# Patient Record
Sex: Female | Born: 1975 | Race: White | Hispanic: No | Marital: Married | Smoking: Never smoker
Health system: Southern US, Community
[De-identification: ages and names within clinical notes are randomized; demographics above are authoritative.]

## PROBLEM LIST (undated history)

## (undated) DIAGNOSIS — Z8489 Family history of other specified conditions: Secondary | ICD-10-CM

## (undated) DIAGNOSIS — F329 Major depressive disorder, single episode, unspecified: Secondary | ICD-10-CM

## (undated) DIAGNOSIS — R51 Headache: Secondary | ICD-10-CM

## (undated) DIAGNOSIS — M797 Fibromyalgia: Secondary | ICD-10-CM

## (undated) DIAGNOSIS — F419 Anxiety disorder, unspecified: Secondary | ICD-10-CM

## (undated) DIAGNOSIS — Z9289 Personal history of other medical treatment: Secondary | ICD-10-CM

## (undated) DIAGNOSIS — I10 Essential (primary) hypertension: Secondary | ICD-10-CM

## (undated) DIAGNOSIS — T4145XA Adverse effect of unspecified anesthetic, initial encounter: Secondary | ICD-10-CM

## (undated) DIAGNOSIS — K219 Gastro-esophageal reflux disease without esophagitis: Secondary | ICD-10-CM

## (undated) DIAGNOSIS — F32A Depression, unspecified: Secondary | ICD-10-CM

## (undated) DIAGNOSIS — T8859XA Other complications of anesthesia, initial encounter: Secondary | ICD-10-CM

## (undated) DIAGNOSIS — M1990 Unspecified osteoarthritis, unspecified site: Secondary | ICD-10-CM

## (undated) HISTORY — PX: BACK SURGERY: SHX140

---

## 2011-06-30 DIAGNOSIS — Z9289 Personal history of other medical treatment: Secondary | ICD-10-CM

## 2011-06-30 HISTORY — DX: Personal history of other medical treatment: Z92.89

## 2011-06-30 HISTORY — PX: RECTAL SURGERY: SHX760

## 2014-02-07 NOTE — H&P (Signed)
  Suzanne Bates is an 38 y.o. female.    Chief Complaint: left carpal tunnel syndrome  HPI: Pt is a 38 y.o. female complaining of left wrist pain and numbness for multiple months. Pain had continually increased since the beginning. Pt has tried various conservative treatments which have failed to alleviate their symptoms, including splinting and therapy. Various options are discussed with the patient. Risks, benefits and expectations were discussed with the patient. Patient understand the risks, benefits and expectations and wishes to proceed with surgery.   PCP:  Milda SmartALTON,TIMOTHY J, MD  D/C Plans:  Home   PMH: No past medical history on file.  PSH: No past surgical history on file.  Social History:  has no tobacco, alcohol, and drug history on file.  Allergies:  Allergies not on file  Medications: No current facility-administered medications for this encounter.   No current outpatient prescriptions on file.    No results found for this or any previous visit (from the past 48 hour(s)). No results found.  ROS: Pain with rom of the left upper extremity  Physical Exam:  Alert and oriented 38 y.o. female in no acute distress Cranial nerves 2-12 intact Cervical spine: full rom with no tenderness, nv intact distally Chest: active breath sounds bilaterally, no wheeze rhonchi or rales Heart: regular rate and rhythm, no murmur Abd: non tender non distended with active bowel sounds Hip is stable with rom  left wrist with positive tinel's and phalen's  nv intact distally No rashes or edema All flexors and extensors intact  Assessment/Plan Assessment: left carpal tunnel syndrome   Plan: Patient will undergo a left carpal tunnel release by Dr. Ranell PatrickNorris at [facility]. Risks benefits and expectations were discussed with the patient. Patient understand risks, benefits and expectations and wishes to proceed.

## 2014-02-08 ENCOUNTER — Encounter (HOSPITAL_COMMUNITY): Payer: Self-pay | Admitting: Pharmacy Technician

## 2014-02-09 ENCOUNTER — Ambulatory Visit (HOSPITAL_COMMUNITY)
Admission: RE | Admit: 2014-02-09 | Discharge: 2014-02-09 | Disposition: A | Discharge status: Home / Self Care | Payer: 59 | Source: Ambulatory Visit | Attending: Orthopedic Surgery | Admitting: Orthopedic Surgery

## 2014-02-09 ENCOUNTER — Encounter (HOSPITAL_COMMUNITY): Payer: Self-pay

## 2014-02-09 ENCOUNTER — Encounter (HOSPITAL_COMMUNITY)
Admission: RE | Admit: 2014-02-09 | Discharge: 2014-02-09 | Disposition: A | Discharge status: Home / Self Care | Payer: 59 | Source: Ambulatory Visit | Attending: Orthopedic Surgery | Admitting: Orthopedic Surgery

## 2014-02-09 DIAGNOSIS — I10 Essential (primary) hypertension: Secondary | ICD-10-CM | POA: Insufficient documentation

## 2014-02-09 DIAGNOSIS — Z01818 Encounter for other preprocedural examination: Secondary | ICD-10-CM | POA: Insufficient documentation

## 2014-02-09 HISTORY — DX: Headache: R51

## 2014-02-09 HISTORY — DX: Anxiety disorder, unspecified: F41.9

## 2014-02-09 HISTORY — DX: Fibromyalgia: M79.7

## 2014-02-09 HISTORY — DX: Depression, unspecified: F32.A

## 2014-02-09 HISTORY — DX: Other complications of anesthesia, initial encounter: T88.59XA

## 2014-02-09 HISTORY — DX: Adverse effect of unspecified anesthetic, initial encounter: T41.45XA

## 2014-02-09 HISTORY — DX: Family history of other specified conditions: Z84.89

## 2014-02-09 HISTORY — DX: Gastro-esophageal reflux disease without esophagitis: K21.9

## 2014-02-09 HISTORY — DX: Personal history of other medical treatment: Z92.89

## 2014-02-09 HISTORY — DX: Essential (primary) hypertension: I10

## 2014-02-09 HISTORY — DX: Major depressive disorder, single episode, unspecified: F32.9

## 2014-02-09 HISTORY — DX: Unspecified osteoarthritis, unspecified site: M19.90

## 2014-02-09 LAB — BASIC METABOLIC PANEL
Anion gap: 16 — ABNORMAL HIGH (ref 5–15)
BUN: 12 mg/dL (ref 6–23)
CHLORIDE: 102 meq/L (ref 96–112)
CO2: 23 mEq/L (ref 19–32)
CREATININE: 0.78 mg/dL (ref 0.50–1.10)
Calcium: 9.8 mg/dL (ref 8.4–10.5)
GFR calc Af Amer: >90 mL/min (ref >90)
Glucose, Bld: 78 mg/dL (ref 70–99)
Potassium: 3.6 mEq/L — ABNORMAL LOW (ref 3.7–5.3)
SODIUM: 141 meq/L (ref 137–147)

## 2014-02-09 LAB — CBC
HCT: 41.4 % (ref 36.0–46.0)
Hemoglobin: 14.1 g/dL (ref 12.0–15.0)
MCH: 30.3 pg (ref 26.0–34.0)
MCHC: 34.1 g/dL (ref 30.0–36.0)
MCV: 89 fL (ref 78.0–100.0)
PLATELETS: 280 10*3/uL (ref 150–400)
RBC: 4.65 MIL/uL (ref 3.87–5.11)
RDW: 13.1 % (ref 11.5–15.5)
WBC: 7.9 10*3/uL (ref 4.0–10.5)

## 2014-02-09 LAB — HCG, SERUM, QUALITATIVE: PREG SERUM: NEGATIVE

## 2014-02-09 NOTE — Progress Notes (Signed)
Call to [facility], requested echo/stress, done- 2012, spoke with Debbie.  It will be faxed to [phone]

## 2014-02-09 NOTE — Pre-Procedure Instructions (Addendum)
Suzanne Bates  02/09/2014   Your procedure is scheduled on:  02/16/2014  [facility]     11[facility]30 [facility].  Call this number if you have problems the morning of surgery: [phone]   Remember:   Do not eat food or drink liquids after midnight. On Thursday evening    Take these medicines the morning of surgery with A SIP OF WATER:  Zyrtec, Omeprazole   Do not wear jewelry, make-up or nail polish.   Do not wear lotions, powders, or perfumes. You may wear deodorant.   Do not shave 48 hours prior to surgery             Do not bring valuables to the hospital.  [facility] is not responsible for any belongings or valuables.               Contacts, dentures or bridgework may not be worn into surgery.  Leave suitcase in the car. After surgery it may be brought to your room.  For patients admitted to the hospital, discharge time is determined by your                treatment team.               Patients discharged the day of surgery will not be allowed to drive  home.  Name and phone number of your driver: spouse   Special Instructions: Special Instructions: [place] - Preparing for Surgery  Before surgery, you can play an important role.  Because skin is not sterile, your skin needs to be as free of germs as possible.  You can reduce the number of germs on you skin by washing with CHG (chlorahexidine gluconate) soap before surgery.  CHG is an antiseptic cleaner which kills germs and bonds with the skin to continue killing germs even after washing.  Please DO NOT use if you have an allergy to CHG or antibacterial soaps.  If your skin becomes reddened/irritated stop using the CHG and inform your nurse when you arrive at Short Stay.  Do not shave (including legs and underarms) for at least 48 hours prior to the first CHG shower.  You may shave your face.  Please follow these instructions carefully:   1.  Shower with CHG Soap the night before surgery  and the  morning of Surgery.  2.  If you choose to wash your hair, wash your hair first as usual with your  normal shampoo.  3.  After you shampoo, rinse your hair and body thoroughly to remove the  Shampoo.  4.  Use CHG as you would any other liquid soap.  You can apply chg directly to the skin and wash gently with scrungie or a clean washcloth.  5.  Apply the CHG Soap to your body ONLY FROM THE NECK DOWN.    Do not use on open wounds or open sores.  Avoid contact with your eyes, ears, mouth and genitals (private parts).  Wash genitals (private parts)   with your normal soap.  6.  Wash thoroughly, paying special attention to the area where your surgery will be performed.  7.  Thoroughly rinse your body with warm water from the neck down.  8.  DO NOT shower/wash with your normal soap after using and rinsing off   the CHG Soap.  9.  Pat yourself dry with a clean towel.  10.  Wear clean pajamas.            11.  Place clean sheets on your bed the night of your first shower and do not sleep with pets.  Day of Surgery  Do not apply any lotions/deodorants the morning of surgery.  Please wear clean clothes to the hospital/surgery center.   Please read over the following fact sheets that you were given: Pain Booklet, Coughing and Deep Breathing and Surgical Site Infection Prevention

## 2014-02-12 NOTE — Progress Notes (Addendum)
Anesthesia Chart Review:  Patient is a 38 year old female scheduled left carpal tunnel release on 02/16/14.    History includes non-smoker, HTN, anxiety, depression, GERD, hiatal hernia, hypothyroidism, arthritis, fibromyalgia, migraines, back surgery, rectal surgery for fissure.  For anesthesia history, she reported that her father woke up during hips surgery.  Weight is recorded as 122.6 kg.  PCP is listed as Dr. Pecolia Adesimothy Dalton.    EKG on 02/09/14 showed: NSR, cannot rule out anterior infarct (age undetermined).  She had a [facility].  LV was normal in size, wall thickness, and wall motion with EF 60-65%.  2D echo on 06/05/11 showed: LVEF 65-70%, mildly dilated LA, trace MR/TR.  Preoperative CXR and labs noted.   No CV symptoms documented at PAT. No known history of DM, CAD/CHF.  Normal stress echo < 3 years ago. Further evaluation by her assigned anesthesiologist on the day of surgery.  If no acute changes then I would anticipate that she could proceed as planned.  Velna Ochsllison Karalyn Kadel, PA-C [facility]/Anesthesiology Phone [phone] 02/12/2014 1:32 PM

## 2014-02-15 MED ORDER — DEXTROSE 5 % IV SOLN
3.0000 g | INTRAVENOUS | Status: AC
  Administered 2014-02-16: 3 g via INTRAVENOUS
  Filled 2014-02-15: qty 3000

## 2014-02-16 ENCOUNTER — Encounter (HOSPITAL_COMMUNITY): Payer: Self-pay | Admitting: Anesthesiology

## 2014-02-16 ENCOUNTER — Ambulatory Visit (HOSPITAL_COMMUNITY)
Admission: RE | Admit: 2014-02-16 | Discharge: 2014-02-16 | Disposition: A | Discharge status: Home / Self Care | Payer: 59 | Source: Ambulatory Visit | Attending: Orthopedic Surgery | Admitting: Orthopedic Surgery

## 2014-02-16 ENCOUNTER — Encounter (HOSPITAL_COMMUNITY): Payer: 59 | Admitting: Vascular Surgery

## 2014-02-16 ENCOUNTER — Ambulatory Visit (HOSPITAL_COMMUNITY): Payer: 59 | Admitting: Anesthesiology

## 2014-02-16 ENCOUNTER — Encounter (HOSPITAL_COMMUNITY)
Admission: RE | Disposition: A | Discharge status: Home / Self Care | Payer: Self-pay | Source: Ambulatory Visit | Attending: Orthopedic Surgery

## 2014-02-16 DIAGNOSIS — I10 Essential (primary) hypertension: Secondary | ICD-10-CM | POA: Insufficient documentation

## 2014-02-16 DIAGNOSIS — K219 Gastro-esophageal reflux disease without esophagitis: Secondary | ICD-10-CM | POA: Diagnosis not present

## 2014-02-16 DIAGNOSIS — G5600 Carpal tunnel syndrome, unspecified upper limb: Secondary | ICD-10-CM | POA: Insufficient documentation

## 2014-02-16 HISTORY — PX: CARPAL TUNNEL RELEASE: SHX101

## 2014-02-16 SURGERY — CARPAL TUNNEL RELEASE
Anesthesia: Monitor Anesthesia Care | Site: Wrist | Laterality: Left

## 2014-02-16 MED ORDER — LIDOCAINE HCL (PF) 1 % IJ SOLN
INTRAMUSCULAR | Status: DC | PRN
  Administered 2014-02-16: 4 mL

## 2014-02-16 MED ORDER — PROPOFOL 10 MG/ML IV BOLUS
INTRAVENOUS | Status: AC
  Filled 2014-02-16: qty 20

## 2014-02-16 MED ORDER — LIDOCAINE HCL (CARDIAC) 20 MG/ML IV SOLN
INTRAVENOUS | Status: AC
  Filled 2014-02-16: qty 5

## 2014-02-16 MED ORDER — LACTATED RINGERS IV SOLN
INTRAVENOUS | Status: DC | PRN
  Administered 2014-02-16: 12:00:00 via INTRAVENOUS

## 2014-02-16 MED ORDER — BUPIVACAINE HCL (PF) 0.25 % IJ SOLN
INTRAMUSCULAR | Status: AC
  Filled 2014-02-16: qty 30

## 2014-02-16 MED ORDER — OXYCODONE-ACETAMINOPHEN 5-325 MG PO TABS: 1.0000 | ORAL_TABLET | ORAL | Status: AC | PRN

## 2014-02-16 MED ORDER — ONDANSETRON HCL 4 MG/2ML IJ SOLN: 4.0000 mg | Freq: Once | INTRAMUSCULAR | Status: DC | PRN

## 2014-02-16 MED ORDER — HYDROMORPHONE HCL PF 1 MG/ML IJ SOLN
0.5000 mg | INTRAMUSCULAR | Status: DC | PRN
  Administered 2014-02-16: 0.5 mg via INTRAVENOUS

## 2014-02-16 MED ORDER — SODIUM BICARBONATE 4 % IV SOLN
INTRAVENOUS | Status: AC
  Filled 2014-02-16: qty 5

## 2014-02-16 MED ORDER — BUPIVACAINE-EPINEPHRINE (PF) 0.25% -1:200000 IJ SOLN
INTRAMUSCULAR | Status: AC
  Filled 2014-02-16: qty 30

## 2014-02-16 MED ORDER — SODIUM BICARBONATE 4 % IV SOLN
INTRAVENOUS | Status: DC | PRN
  Administered 2014-02-16: 1 mL

## 2014-02-16 MED ORDER — MIDAZOLAM HCL 5 MG/5ML IJ SOLN
INTRAMUSCULAR | Status: DC | PRN
  Administered 2014-02-16: 2 mg via INTRAVENOUS

## 2014-02-16 MED ORDER — LIDOCAINE HCL (PF) 1 % IJ SOLN
INTRAMUSCULAR | Status: AC
  Filled 2014-02-16: qty 30

## 2014-02-16 MED ORDER — BUPIVACAINE HCL (PF) 0.25 % IJ SOLN
INTRAMUSCULAR | Status: DC | PRN
  Administered 2014-02-16: 4 mL

## 2014-02-16 MED ORDER — ONDANSETRON HCL 4 MG/2ML IJ SOLN
INTRAMUSCULAR | Status: AC
  Filled 2014-02-16: qty 2

## 2014-02-16 MED ORDER — HYDROMORPHONE HCL PF 1 MG/ML IJ SOLN
INTRAMUSCULAR | Status: AC
  Filled 2014-02-16: qty 1

## 2014-02-16 MED ORDER — PROPOFOL INFUSION 10 MG/ML OPTIME
INTRAVENOUS | Status: DC | PRN
  Administered 2014-02-16: 100 ug/kg/min via INTRAVENOUS

## 2014-02-16 MED ORDER — HYDROMORPHONE HCL PF 1 MG/ML IJ SOLN: 0.2500 mg | INTRAMUSCULAR | Status: DC | PRN

## 2014-02-16 MED ORDER — FENTANYL CITRATE 0.05 MG/ML IJ SOLN
INTRAMUSCULAR | Status: DC | PRN
Start: 2014-02-16 — End: 2014-02-16
  Administered 2014-02-16 (×2): 50 ug via INTRAVENOUS

## 2014-02-16 MED ORDER — CHLORHEXIDINE GLUCONATE 4 % EX LIQD
60.0000 mL | Freq: Once | CUTANEOUS | Status: DC
  Filled 2014-02-16: qty 60

## 2014-02-16 MED ORDER — MIDAZOLAM HCL 2 MG/2ML IJ SOLN
INTRAMUSCULAR | Status: AC
  Filled 2014-02-16: qty 2

## 2014-02-16 MED ORDER — FENTANYL CITRATE 0.05 MG/ML IJ SOLN
INTRAMUSCULAR | Status: AC
  Filled 2014-02-16: qty 5

## 2014-02-16 MED ORDER — LACTATED RINGERS IV SOLN
Freq: Once | INTRAVENOUS | Status: AC
  Administered 2014-02-16: 13:00:00 via INTRAVENOUS

## 2014-02-16 SURGICAL SUPPLY — 43 items
BANDAGE ELASTIC 3 VELCRO ST LF (GAUZE/BANDAGES/DRESSINGS) ×3 IMPLANT
BANDAGE ELASTIC 4 VELCRO ST LF (GAUZE/BANDAGES/DRESSINGS) ×3 IMPLANT
BNDG ESMARK 4X9 LF (GAUZE/BANDAGES/DRESSINGS) ×3 IMPLANT
BNDG GAUZE ELAST 4 BULKY (GAUZE/BANDAGES/DRESSINGS) ×3 IMPLANT
COVER SURGICAL LIGHT HANDLE (MISCELLANEOUS) ×3 IMPLANT
CUFF TOURNIQUET SINGLE 18IN (TOURNIQUET CUFF) ×3 IMPLANT
CUFF TOURNIQUET SINGLE 24IN (TOURNIQUET CUFF) IMPLANT
DRAPE SURG 17X23 STRL (DRAPES) ×3 IMPLANT
DURAPREP 26ML APPLICATOR (WOUND CARE) ×3 IMPLANT
ELECT NEEDLE TIP 2.8 STRL (NEEDLE) ×3 IMPLANT
GAUZE SPONGE 4X4 12PLY STRL (GAUZE/BANDAGES/DRESSINGS) ×3 IMPLANT
GAUZE XEROFORM 1X8 LF (GAUZE/BANDAGES/DRESSINGS) ×3 IMPLANT
GLOVE BIOGEL PI ORTHO PRO 7.5 (GLOVE) ×2
GLOVE BIOGEL PI ORTHO PRO SZ8 (GLOVE) ×2
GLOVE ORTHO TXT STRL SZ7.5 (GLOVE) ×3 IMPLANT
GLOVE PI ORTHO PRO STRL 7.5 (GLOVE) ×1 IMPLANT
GLOVE PI ORTHO PRO STRL SZ8 (GLOVE) ×1 IMPLANT
GLOVE SURG ORTHO 8.5 STRL (GLOVE) ×3 IMPLANT
GOWN STRL REUS W/ TWL LRG LVL3 (GOWN DISPOSABLE) ×2 IMPLANT
GOWN STRL REUS W/ TWL XL LVL3 (GOWN DISPOSABLE) ×2 IMPLANT
GOWN STRL REUS W/TWL LRG LVL3 (GOWN DISPOSABLE) ×4
GOWN STRL REUS W/TWL XL LVL3 (GOWN DISPOSABLE) ×4
KIT BASIN OR (CUSTOM PROCEDURE TRAY) ×3 IMPLANT
KIT ROOM TURNOVER OR (KITS) ×3 IMPLANT
NEEDLE HYPO 25GX1X1/2 BEV (NEEDLE) IMPLANT
NS IRRIG 1000ML POUR BTL (IV SOLUTION) ×3 IMPLANT
PACK ORTHO EXTREMITY (CUSTOM PROCEDURE TRAY) ×3 IMPLANT
PAD ARMBOARD 7.5X6 YLW CONV (MISCELLANEOUS) ×6 IMPLANT
PAD CAST 3X4 CTTN HI CHSV (CAST SUPPLIES) ×1 IMPLANT
PAD CAST 4YDX4 CTTN HI CHSV (CAST SUPPLIES) ×1 IMPLANT
PADDING CAST COTTON 3X4 STRL (CAST SUPPLIES) ×2
PADDING CAST COTTON 4X4 STRL (CAST SUPPLIES) ×2
SPONGE GAUZE 4X4 12PLY STER LF (GAUZE/BANDAGES/DRESSINGS) ×3 IMPLANT
SPONGE SCRUB IODOPHOR (GAUZE/BANDAGES/DRESSINGS) ×3 IMPLANT
SUCTION FRAZIER TIP 10 FR DISP (SUCTIONS) IMPLANT
SUT ETHILON 3 0 PS 1 (SUTURE) ×3 IMPLANT
SYR CONTROL 10ML LL (SYRINGE) IMPLANT
TOWEL OR 17X24 6PK STRL BLUE (TOWEL DISPOSABLE) ×3 IMPLANT
TOWEL OR 17X26 10 PK STRL BLUE (TOWEL DISPOSABLE) ×3 IMPLANT
TUBE CONNECTING 12'X1/4 (SUCTIONS)
TUBE CONNECTING 12X1/4 (SUCTIONS) IMPLANT
UNDERPAD 30X30 INCONTINENT (UNDERPADS AND DIAPERS) ×3 IMPLANT
WATER STERILE IRR 1000ML POUR (IV SOLUTION) ×3 IMPLANT

## 2014-02-16 NOTE — Anesthesia Procedure Notes (Signed)
Procedure Name: MAC Date/Time: 02/16/2014 1:21 PM Performed by: Quentin OreWALKER, Xylia Scherger E Pre-anesthesia Checklist: Patient identified, Emergency Drugs available, Suction available, Patient being monitored and Timeout performed Patient Re-evaluated:Patient Re-evaluated prior to inductionOxygen Delivery Method: Simple face mask Preoxygenation: Pre-oxygenation with 100% oxygen Intubation Type: IV induction Placement Confirmation: positive ETCO2

## 2014-02-16 NOTE — Op Note (Signed)
[name], Suzanne Bates              ACCOUNT NO.:  [id number]  MEDICAL RECORD NO.:  [id number]  LOCATION:  [facility]                         FACILITY:  [facility]  PHYSICIAN:  Almedia BallsSteven R. Ranell PatrickNorris, M.D. DATE OF BIRTH:  July 10, 1975  DATE OF PROCEDURE:  02/16/2014 DATE OF DISCHARGE:  02/16/2014                              OPERATIVE REPORT   PREOPERATIVE DIAGNOSIS:  Left hand severe carpal tunnel syndrome.  POSTOPERATIVE DIAGNOSIS:  Left hand severe carpal tunnel syndrome.  PROCEDURE PERFORMED:  Left hand carpal tunnel release.  ATTENDING SURGEON:  Almedia BallsSteven R. Ranell PatrickNorris, M.D.  ASSISTANT:  None.  ANESTHESIA:  Local anesthesia and that was carpal block plus MAC was used.  ESTIMATED BLOOD LOSS:  Minimal.  FLUID REPLACEMENT:  500 mL crystalloid.  INSTRUMENT COUNTS:  Correct.  COMPLICATIONS:  None.  TOURNIQUET TIME:  20 minutes at 275 mmHg.  INDICATIONS:  The patient is a 38 year old female with worsening left hand pain, numbness, and tingling secondary to severe carpal tunnel syndrome by EMG nerve conduction study.  Patient has evidence of denervation and atrophy necessitating carpal tunnel release.  The patient understands and informed consent was obtained.  DESCRIPTION OF PROCEDURE:  After an adequate level of anesthesia achieved, the patient was positioned supine on the operating room table. Left hand was sterilely prepped and draped in usual manner over a nonsterile tourniquet on the forearm.  Time-out was called.  After time- out, we went ahead and exsanguinated the limb using an Esmarch bandage and elevated the tourniquet to 275 mmHg.  Longitudinal palmar incision was created in line with the 3rd and 4th ray under loupe magnification. Dissection down through subcutaneous tissues.  Superficial palmar fascia was divided.  Transverse carpal ligament was identified and divided in its entirety into the mid carpal space and also into the superficial forearm fascia, had complete release of the  nerve which had significant hyperemia indicating a chronic compression.  We inspected the nerve, it was intact.  We thoroughly irrigated and closed with interrupted nylon closure followed by sterile compressive bandage and a short-arm splint. Patient tolerated the procedure well and was taken to recovery room in stable condition.     Almedia BallsSteven R. Ranell PatrickNorris, M.D.     SRN/MEDQ  D:  02/16/2014  T:  02/16/2014  Job:  [id number]

## 2014-02-16 NOTE — Anesthesia Postprocedure Evaluation (Signed)
  Anesthesia Post-op Note  Patient: Suzanne Bates  Procedure(s) Performed: Procedure(s): LEFT CARPAL TUNNEL RELEASE (Left)  Patient Location: PACU  Anesthesia Type:MAC  Level of Consciousness: awake, alert , oriented and patient cooperative  Airway and Oxygen Therapy: Patient Spontanous Breathing  Post-op Pain: none  Post-op Assessment: Post-op Vital signs reviewed, Patient's Cardiovascular Status Stable, Respiratory Function Stable, Patent Airway and No signs of Nausea or vomiting  Post-op Vital Signs: stable  Last Vitals:  Filed Vitals:   02/16/14 1134  BP: 119/38  Pulse: 83  Temp: 36.7 C  Resp: 18    Complications: No apparent anesthesia complications

## 2014-02-16 NOTE — Transfer of Care (Signed)
Immediate Anesthesia Transfer of Care Note  Patient: Suzanne Bates  Procedure(s) Performed: Procedure(s): LEFT CARPAL TUNNEL RELEASE (Left)  Patient Location: PACU  Anesthesia Type:MAC  Level of Consciousness: awake, alert  and oriented  Airway & Oxygen Therapy: Patient Spontanous Breathing and Patient connected to face mask oxygen  Post-op Assessment: Report given to PACU RN, Post -op Vital signs reviewed and stable and Patient moving all extremities X 4  Post vital signs: Reviewed and stable  Complications: No apparent anesthesia complications

## 2014-02-16 NOTE — Brief Op Note (Signed)
02/16/2014  2:10 PM  PATIENT:  Suzanne ElliotNatasha Bates  38 y.o. female  PRE-OPERATIVE DIAGNOSIS:  LEFT CARPAL TUNNEL SYNDROME  POST-OPERATIVE DIAGNOSIS:  LEFT CARPAL TUNNEL SYNDROME  PROCEDURE:  Procedure(s): LEFT CARPAL TUNNEL RELEASE (Left)  SURGEON:  Surgeon(s) and Role:    * Verlee RossettiSteven R Walda Hertzog, MD - Primary  PHYSICIAN ASSISTANT:   ASSISTANTS: none   ANESTHESIA:   local and MAC  EBL:     BLOOD ADMINISTERED:none  DRAINS: none   LOCAL MEDICATIONS USED:  MARCAINE     SPECIMEN:  No Specimen  DISPOSITION OF SPECIMEN:  N/A  COUNTS:  YES  TOURNIQUET:   Total Tourniquet Time Documented: Forearm (Left) - 28 minutes Total: Forearm (Left) - 28 minutes   DICTATION: .Other Dictation: Dictation Number [id number]  PLAN OF CARE: Discharge to home after PACU  PATIENT DISPOSITION:  PACU - hemodynamically stable.   Delay start of Pharmacological VTE agent (>24hrs) due to surgical blood loss or risk of bleeding: not applicable

## 2014-02-16 NOTE — Addendum Note (Signed)
Addendum created 02/16/14 1525 by Laverle HobbyGregory Rihanna Marseille, MD   Modules edited: Orders, PRL Based Order Sets

## 2014-02-16 NOTE — Interval H&P Note (Signed)
History and Physical Interval Note:  02/16/2014 12:40 PM  Suzanne Bates  has presented today for surgery, with the diagnosis of LEFT CARPAL TUNNEL SYNDROME  The various methods of treatment have been discussed with the patient and family. After consideration of risks, benefits and other options for treatment, the patient has consented to  Procedure(s): LEFT CARPAL TUNNEL RELEASE (Left) as a surgical intervention .  The patient's history has been reviewed, patient examined, no change in status, stable for surgery.  I have reviewed the patient's chart and labs.  Questions were answered to the patient's satisfaction.     Nickey Kloepfer,STEVEN R

## 2014-02-16 NOTE — Anesthesia Preprocedure Evaluation (Signed)
Anesthesia Evaluation  Patient identified by MRN, date of birth, ID band Patient awake    Reviewed: Allergy & Precautions, H&P , NPO status , Patient's Chart, lab work & pertinent test results  Airway Mallampati: I      Dental  (+) Teeth Intact   Pulmonary          Cardiovascular hypertension, Pt. on medications     Neuro/Psych    GI/Hepatic GERD-  Medicated and Controlled,  Endo/Other    Renal/GU      Musculoskeletal   Abdominal   Peds  Hematology   Anesthesia Other Findings   Reproductive/Obstetrics                           Anesthesia Physical Anesthesia Plan  ASA: II  Anesthesia Plan: MAC   Post-op Pain Management:    Induction: Intravenous  Airway Management Planned: Mask  Additional Equipment:   Intra-op Plan:   Post-operative Plan:   Informed Consent: I have reviewed the patients History and Physical, chart, labs and discussed the procedure including the risks, benefits and alternatives for the proposed anesthesia with the patient or authorized representative who has indicated his/her understanding and acceptance.   Dental advisory given  Plan Discussed with: CRNA and Anesthesiologist  Anesthesia Plan Comments:         Anesthesia Quick Evaluation

## 2014-02-16 NOTE — Discharge Instructions (Signed)
Elevate hand when possible above the heart.  Wiggle fingers  Ok to remove the splint and bandages on Monday morning.  Apply a large Bandaid then place the night splint.  Follow up in 10-14 days in the office.  What to eat:  For your first meals, you should eat lightly; only small meals initially.  If you do not have nausea, you may eat larger meals.  Avoid spicy, greasy and heavy food.    General Anesthesia, Adult, Care After  Refer to this sheet in the next few weeks. These instructions provide you with information on caring for yourself after your procedure. Your health care provider may also give you more specific instructions. Your treatment has been planned according to current medical practices, but problems sometimes occur. Call your health care provider if you have any problems or questions after your procedure.  WHAT TO EXPECT AFTER THE PROCEDURE  After the procedure, it is typical to experience:  Sleepiness.  Nausea and vomiting. HOME CARE INSTRUCTIONS  For the first 24 hours after general anesthesia:  Have a responsible person with you.  Do not drive a car. If you are alone, do not take public transportation.  Do not drink alcohol.  Do not take medicine that has not been prescribed by your health care provider.  Do not sign important papers or make important decisions.  You may resume a normal diet and activities as directed by your health care provider.  Change bandages (dressings) as directed.  If you have questions or problems that seem related to general anesthesia, call the hospital and ask for the anesthetist or anesthesiologist on call. SEEK MEDICAL CARE IF:  You have nausea and vomiting that continue the day after anesthesia.  You develop a rash. SEEK IMMEDIATE MEDICAL CARE IF:  You have difficulty breathing.  You have chest pain.  You have any allergic problems. Document Released: 09/21/2000 Document Revised: 02/15/2013 Document Reviewed: 12/29/2012  [facility]  Patient Information 2014 [employer] [place].

## 2014-02-19 ENCOUNTER — Encounter (HOSPITAL_COMMUNITY): Payer: Self-pay | Admitting: Orthopedic Surgery

## 2014-06-06 NOTE — H&P (Signed)
Suzanne Bates is an 38 y.o. female.    Chief Complaint: right hand pain and numbness  HPI: Pt is a 38 y.o. female complaining of right hand pain with numbness for multiple months. Pain had continually increased since the beginning.  Pt has tried various conservative treatments which have failed to alleviate their symptoms, including splints and injections. Various options are discussed with the patient. Risks, benefits and expectations were discussed with the patient. Patient understand the risks, benefits and expectations and wishes to proceed with surgery.   PCP:  Milda SmartALTON,TIMOTHY J, MD  D/C Plans:  Home   PMH: Past Medical History  Diagnosis Date  . Complication of anesthesia     psychosis- post op   . Family history of anesthesia complication     father awoke during a hip replacement   . Hypertension   . History of stress test 2013    also had ECHO, due to chest pain, [facility], told that studies were wnl    . Anxiety   . Depression   . Hypothyroidism   . H/O hiatal hernia   . GERD (gastroesophageal reflux disease)   . Headache(784.0)     migraine history- using Topamax with good results.   . Arthritis     carpal tunnel- bilateral, low back degeneration    . Fibromyalgia     PSH: Past Surgical History  Procedure Laterality Date  . Back surgery  2007 & 2010  . Rectal surgery  2013    for fissure  . Vaginal delivery  1994  . Carpal tunnel release Left 02/16/2014    Procedure: LEFT CARPAL TUNNEL RELEASE;  Surgeon: Verlee RossettiSteven R Norris, MD;  [facility];  Service: Orthopedics;  Laterality: Left;    Social History:  reports that she has never smoked. She does not have any smokeless tobacco history on file. She reports that she does not drink alcohol or use illicit drugs.  Allergies:  Allergies  Allergen Reactions  . Tetracyclines & Related Hives and Swelling    Medications: No current facility-administered medications for  this encounter.   Current Outpatient Prescriptions  Medication Sig Dispense Refill  . cetirizine (ZYRTEC) 10 MG tablet Take 10 mg by mouth daily.    . citalopram (CELEXA) 10 MG tablet Take 10 mg by mouth at bedtime.     . hydrochlorothiazide (HYDRODIURIL) 25 MG tablet Take 25 mg by mouth daily before breakfast.     . ibuprofen (ADVIL,MOTRIN) 200 MG tablet Take 400 mg by mouth every 6 (six) hours as needed.    Marland Kitchen. lisinopril (PRINIVIL,ZESTRIL) 10 MG tablet Take 10 mg by mouth at bedtime.     . Multiple Vitamin (MULTIVITAMIN WITH MINERALS) TABS tablet Take 1 tablet by mouth daily.    Marland Kitchen. omeprazole (PRILOSEC) 40 MG capsule Take 40 mg by mouth daily before breakfast.     . rizatriptan (MAXALT-MLT) 5 MG disintegrating tablet Take 5 mg by mouth as needed for migraine. May repeat in 2 hours if needed    . topiramate (TOPAMAX) 25 MG tablet Take 25 mg by mouth at bedtime.     . verapamil (VERELAN) 100 MG 24 hr capsule Take 100 mg by mouth daily before breakfast.    . oxyCODONE-acetaminophen (ROXICET) 5-325 MG per tablet Take 1-2 tablets by mouth every 4 (four) hours as needed for severe pain. (Patient not taking: Reported on 06/05/2014) 60 tablet 0  . verapamil (CALAN-SR) 180 MG CR tablet Take 100  mg by mouth daily before breakfast.       No results found for this or any previous visit (from the past 48 hour(s)). No results found.  ROS: Pain with rom of the right hand   Physical Exam:  Alert and oriented 38 y.o. female in no acute distress Cranial nerves 2-12 intact Cervical spine: full rom with no tenderness, nv intact distally Chest: active breath sounds bilaterally, no wheeze rhonchi or rales Heart: regular rate and rhythm, no murmur Abd: non tender non distended with active bowel sounds Hip is stable with rom  Right hand/wrist with positive tinel's and phalen's nv intact distally No rashes or edema  Assessment/Plan Assessment: right carpal tunnel syndromve   Plan: Patient will undergo  a right [facility]pal tunnel [facility]. Risks benefits and expectations were discussed with the patient. Patient understand risks, benefits and expectations and wishes to proceed.

## 2014-06-07 NOTE — Pre-Procedure Instructions (Signed)
Crista Elliotatasha Coleson  06/07/2014   Your procedure is scheduled on:  Friday, December 18th  [facility] at [facility] AM.  Call this number if you have problems the morning of surgery: [phone]   Remember:   Do not eat food or drink liquids after midnight.   Take these medicines the morning of surgery with A SIP OF WATER: zyrtec, prilosec, oxycodone if needed   Do not wear jewelry, make-up or nail polish.  Do not wear lotions, powders, or perfumes, deodorant.  Do not shave 48 hours prior to surgery. Men may shave face and neck.  Do not bring valuables to the hospital.  [facility] is not responsible  for any belongings or valuables.               Contacts, dentures or bridgework may not be worn into surgery.  Leave suitcase in the car. After surgery it may be brought to your room.  For patients admitted to the hospital, discharge time is determined by your treatment team.               Patients discharged the day of surgery will not be allowed to drive home.  Please read over the following fact sheets that you were given: Pain Booklet, Coughing and Deep Breathing and Surgical Site Infection Prevention   - Preparing for Surgery  Before surgery, you can play an important role.  Because skin is not sterile, your skin needs to be as free of germs as possible.  You can reduce the number of germs on you skin by washing with CHG (chlorahexidine gluconate) soap before surgery.  CHG is an antiseptic cleaner which kills germs and bonds with the skin to continue killing germs even after washing.  Please DO NOT use if you have an allergy to CHG or antibacterial soaps.  If your skin becomes reddened/irritated stop using the CHG and inform your nurse when you arrive at Short Stay.  Do not shave (including legs and underarms) for at least 48 hours prior to the first CHG shower.  You may shave your face.  Please follow these instructions carefully:   1.  Shower with  CHG Soap the night before surgery and the morning of Surgery.  2.  If you choose to wash your hair, wash your hair first as usual with your normal shampoo.  3.  After you shampoo, rinse your hair and body thoroughly to remove the shampoo.  4.  Use CHG as you would any other liquid soap.  You can apply CHG directly to the skin and wash gently with scrungie or a clean washcloth.  5.  Apply the CHG Soap to your body ONLY FROM THE NECK DOWN.  Do not use on open wounds or open sores.  Avoid contact with your eyes, ears, mouth and genitals (private parts).  Wash genitals (private parts) with your normal soap.  6.  Wash thoroughly, paying special attention to the area where your surgery will be performed.  7.  Thoroughly rinse your body with warm water from the neck down.  8.  DO NOT shower/wash with your normal soap after using and rinsing off the CHG Soap.  9.  Pat yourself dry with a clean towel.            10.  Wear clean pajamas.            11.  Place clean sheets on your bed the night of your first shower  and do not sleep with pets.  Day of Surgery  Do not apply any lotions/deoderants the morning of surgery.  Please wear clean clothes to the hospital/surgery center.

## 2014-06-08 ENCOUNTER — Encounter (HOSPITAL_COMMUNITY)
Admission: RE | Admit: 2014-06-08 | Discharge: 2014-06-08 | Disposition: A | Discharge status: Home / Self Care | Payer: 59 | Source: Ambulatory Visit | Attending: Orthopedic Surgery | Admitting: Orthopedic Surgery

## 2014-06-08 ENCOUNTER — Encounter (HOSPITAL_COMMUNITY): Payer: Self-pay

## 2014-06-08 DIAGNOSIS — Z01812 Encounter for preprocedural laboratory examination: Secondary | ICD-10-CM | POA: Insufficient documentation

## 2014-06-08 LAB — CBC
HCT: 40.5 % (ref 36.0–46.0)
HEMOGLOBIN: 13.3 g/dL (ref 12.0–15.0)
MCH: 29.4 pg (ref 26.0–34.0)
MCHC: 32.8 g/dL (ref 30.0–36.0)
MCV: 89.6 fL (ref 78.0–100.0)
Platelets: 281 10*3/uL (ref 150–400)
RBC: 4.52 MIL/uL (ref 3.87–5.11)
RDW: 13.1 % (ref 11.5–15.5)
WBC: 8 10*3/uL (ref 4.0–10.5)

## 2014-06-08 LAB — BASIC METABOLIC PANEL
Anion gap: 16 — ABNORMAL HIGH (ref 5–15)
BUN: 12 mg/dL (ref 6–23)
CO2: 21 meq/L (ref 19–32)
Calcium: 9.6 mg/dL (ref 8.4–10.5)
Chloride: 102 mEq/L (ref 96–112)
Creatinine, Ser: 0.62 mg/dL (ref 0.50–1.10)
GFR calc Af Amer: >90 mL/min (ref >90)
GFR calc non Af Amer: >90 mL/min (ref >90)
GLUCOSE: 88 mg/dL (ref 70–99)
POTASSIUM: 3.8 meq/L (ref 3.7–5.3)
Sodium: 139 mEq/L (ref 137–147)

## 2014-06-08 LAB — HCG, SERUM, QUALITATIVE: PREG SERUM: POSITIVE — AB

## 2014-06-08 NOTE — Progress Notes (Signed)
Message left with Dr Ranell PatrickNorris office related to positive HCG test.

## 2014-06-08 NOTE — Progress Notes (Signed)
   06/08/14 1125  OBSTRUCTIVE SLEEP APNEA  Score 4 or greater  Results sent to PCP   

## 2014-06-08 NOTE — Progress Notes (Signed)
PCP is Dr Pecolia Adesimothy Dalton States that [place] cardi[place] in [place] and had a echo and stress test done then. States that everything was ok. (See Allison's note from Aug of this year)  EKG and CXR noted in epic from 02-09-14

## 2014-06-08 NOTE — Progress Notes (Deleted)
   06/08/14 1125  OBSTRUCTIVE SLEEP APNEA  Score 4 or greater  Results sent to PCP

## 2014-06-11 NOTE — Progress Notes (Signed)
[facility] related to positive HCG test.

## 2014-06-14 MED ORDER — DEXTROSE 5 % IV SOLN
3.0000 g | INTRAVENOUS | Status: AC
  Administered 2014-06-15: 3 g via INTRAVENOUS
  Filled 2014-06-14: qty 3000

## 2014-06-14 NOTE — Progress Notes (Signed)
Anesthesia Chart Review:  Patient is a 38 year old female scheduled for right carpal tunnel release on 06/15/14 by Dr. Ranell PatrickNorris.  History includes HTN, non-smoker, GERD, fibromyalgia, anxiety, depression, post-operative psychosis, father "woke up" during THA (unclear if this was done under [facility] or regional), migraines, back surgery.  BMI is consistent with morbid obesity. PCP is listed as Dr. Pecolia Adesimothy Dalton.  EKG 02/09/14: NSR, cannot rule out anterior infarct (age undetermined). [facility] (scanned under Media tab).  Normal stress echo on 06/14/11.  06/05/11 Echo: Grossly normal 2D echo and doppler study. LVEF 65-70%. Normal diastolic function. Trace MR/TR. Estimation of RVSP was not possible.   02/09/14 CXR: No edema or consolidation.  Preoperative labs noted. Serum qualitative hCG was positive.  (She reported an IUD in place.)  Dr. Ranell PatrickNorris' office referred her to her GYN to confirm.  Quanatative serum hCG (ordered by Roe RutherfordMichelle Wall, PA-C) was 1 consistent with a non-pregnant adult female.  Anticipate that she can proceed as planned.  Velna Ochsllison Conor Filsaime, PA-C [facility]Anesthesiology Phone [phone] 06/14/2014 11:41 AM

## 2014-06-15 ENCOUNTER — Ambulatory Visit (HOSPITAL_COMMUNITY): Payer: 59 | Admitting: Certified Registered Nurse Anesthetist

## 2014-06-15 ENCOUNTER — Encounter (HOSPITAL_COMMUNITY)
Admission: RE | Disposition: A | Discharge status: Home / Self Care | Payer: Self-pay | Source: Ambulatory Visit | Attending: Orthopedic Surgery

## 2014-06-15 ENCOUNTER — Ambulatory Visit (HOSPITAL_COMMUNITY): Payer: 59 | Admitting: Vascular Surgery

## 2014-06-15 ENCOUNTER — Ambulatory Visit (HOSPITAL_COMMUNITY)
Admission: RE | Admit: 2014-06-15 | Discharge: 2014-06-15 | Disposition: A | Discharge status: Home / Self Care | Payer: 59 | Source: Ambulatory Visit | Attending: Orthopedic Surgery | Admitting: Orthopedic Surgery

## 2014-06-15 ENCOUNTER — Encounter (HOSPITAL_COMMUNITY): Payer: Self-pay | Admitting: *Deleted

## 2014-06-15 DIAGNOSIS — Z880 Allergy status to penicillin: Secondary | ICD-10-CM | POA: Insufficient documentation

## 2014-06-15 DIAGNOSIS — I10 Essential (primary) hypertension: Secondary | ICD-10-CM | POA: Insufficient documentation

## 2014-06-15 DIAGNOSIS — K219 Gastro-esophageal reflux disease without esophagitis: Secondary | ICD-10-CM | POA: Diagnosis not present

## 2014-06-15 DIAGNOSIS — F329 Major depressive disorder, single episode, unspecified: Secondary | ICD-10-CM | POA: Insufficient documentation

## 2014-06-15 DIAGNOSIS — G43909 Migraine, unspecified, not intractable, without status migrainosus: Secondary | ICD-10-CM | POA: Diagnosis not present

## 2014-06-15 DIAGNOSIS — M797 Fibromyalgia: Secondary | ICD-10-CM | POA: Insufficient documentation

## 2014-06-15 DIAGNOSIS — G5601 Carpal tunnel syndrome, right upper limb: Secondary | ICD-10-CM | POA: Insufficient documentation

## 2014-06-15 DIAGNOSIS — K449 Diaphragmatic hernia without obstruction or gangrene: Secondary | ICD-10-CM | POA: Diagnosis not present

## 2014-06-15 DIAGNOSIS — E039 Hypothyroidism, unspecified: Secondary | ICD-10-CM | POA: Insufficient documentation

## 2014-06-15 DIAGNOSIS — M1990 Unspecified osteoarthritis, unspecified site: Secondary | ICD-10-CM | POA: Diagnosis not present

## 2014-06-15 DIAGNOSIS — F419 Anxiety disorder, unspecified: Secondary | ICD-10-CM | POA: Diagnosis not present

## 2014-06-15 DIAGNOSIS — Z6841 Body Mass Index (BMI) 40.0 and over, adult: Secondary | ICD-10-CM | POA: Diagnosis not present

## 2014-06-15 HISTORY — PX: CARPAL TUNNEL RELEASE: SHX101

## 2014-06-15 SURGERY — CARPAL TUNNEL RELEASE
Anesthesia: Monitor Anesthesia Care | Site: Wrist | Laterality: Right

## 2014-06-15 MED ORDER — ROCURONIUM BROMIDE 50 MG/5ML IV SOLN
INTRAVENOUS | Status: AC
  Filled 2014-06-15: qty 1

## 2014-06-15 MED ORDER — ARTIFICIAL TEARS OP OINT
TOPICAL_OINTMENT | OPHTHALMIC | Status: AC
  Filled 2014-06-15: qty 3.5

## 2014-06-15 MED ORDER — ONDANSETRON HCL 4 MG/2ML IJ SOLN: 4.0000 mg | Freq: Four times a day (QID) | INTRAMUSCULAR | Status: DC | PRN

## 2014-06-15 MED ORDER — FENTANYL CITRATE 0.05 MG/ML IJ SOLN
25.0000 ug | INTRAMUSCULAR | Status: DC | PRN
  Administered 2014-06-15 (×2): 50 ug via INTRAVENOUS

## 2014-06-15 MED ORDER — PROPOFOL INFUSION 10 MG/ML OPTIME
INTRAVENOUS | Status: DC | PRN
  Administered 2014-06-15: 100 ug/kg/min via INTRAVENOUS

## 2014-06-15 MED ORDER — SODIUM BICARBONATE 4 % IV SOLN
INTRAVENOUS | Status: AC
  Filled 2014-06-15: qty 5

## 2014-06-15 MED ORDER — SODIUM CHLORIDE 0.9 % IJ SOLN
INTRAMUSCULAR | Status: AC
  Filled 2014-06-15: qty 10

## 2014-06-15 MED ORDER — PROPOFOL 10 MG/ML IV BOLUS
INTRAVENOUS | Status: AC
  Filled 2014-06-15: qty 20

## 2014-06-15 MED ORDER — EPHEDRINE SULFATE 50 MG/ML IJ SOLN
INTRAMUSCULAR | Status: AC
  Filled 2014-06-15: qty 1

## 2014-06-15 MED ORDER — MIDAZOLAM HCL 5 MG/5ML IJ SOLN
INTRAMUSCULAR | Status: DC | PRN
  Administered 2014-06-15: 2 mg via INTRAVENOUS

## 2014-06-15 MED ORDER — 0.9 % SODIUM CHLORIDE (POUR BTL) OPTIME
TOPICAL | Status: DC | PRN
  Administered 2014-06-15: 1000 mL

## 2014-06-15 MED ORDER — PHENYLEPHRINE 40 MCG/ML (10ML) SYRINGE FOR IV PUSH (FOR BLOOD PRESSURE SUPPORT)
PREFILLED_SYRINGE | INTRAVENOUS | Status: AC
  Filled 2014-06-15: qty 10

## 2014-06-15 MED ORDER — BUPIVACAINE HCL (PF) 0.25 % IJ SOLN
INTRAMUSCULAR | Status: DC | PRN
  Administered 2014-06-15: 4 mL

## 2014-06-15 MED ORDER — FENTANYL CITRATE 0.05 MG/ML IJ SOLN
INTRAMUSCULAR | Status: AC
  Filled 2014-06-15: qty 2

## 2014-06-15 MED ORDER — SODIUM BICARBONATE 8.4 % IV SOLN
INTRAVENOUS | Status: AC
  Filled 2014-06-15: qty 50

## 2014-06-15 MED ORDER — LIDOCAINE HCL (CARDIAC) 20 MG/ML IV SOLN
INTRAVENOUS | Status: AC
  Filled 2014-06-15: qty 10

## 2014-06-15 MED ORDER — BUPIVACAINE HCL (PF) 0.25 % IJ SOLN
INTRAMUSCULAR | Status: AC
  Filled 2014-06-15: qty 30

## 2014-06-15 MED ORDER — SUCCINYLCHOLINE CHLORIDE 20 MG/ML IJ SOLN
INTRAMUSCULAR | Status: AC
  Filled 2014-06-15: qty 1

## 2014-06-15 MED ORDER — LACTATED RINGERS IV SOLN
INTRAVENOUS | Status: DC | PRN
  Administered 2014-06-15: 07:00:00 via INTRAVENOUS

## 2014-06-15 MED ORDER — ONDANSETRON HCL 4 MG/2ML IJ SOLN
INTRAMUSCULAR | Status: DC | PRN
  Administered 2014-06-15: 4 mg via INTRAVENOUS

## 2014-06-15 MED ORDER — OXYCODONE HCL 5 MG PO TABS
ORAL_TABLET | ORAL | Status: AC
  Filled 2014-06-15: qty 1

## 2014-06-15 MED ORDER — FENTANYL CITRATE 0.05 MG/ML IJ SOLN
INTRAMUSCULAR | Status: AC
  Filled 2014-06-15: qty 5

## 2014-06-15 MED ORDER — CHLORHEXIDINE GLUCONATE 4 % EX LIQD
60.0000 mL | Freq: Once | CUTANEOUS | Status: DC
Start: 2014-06-15 — End: 2014-06-15
  Filled 2014-06-15: qty 60

## 2014-06-15 MED ORDER — LIDOCAINE HCL (PF) 1 % IJ SOLN
INTRAMUSCULAR | Status: DC | PRN
  Administered 2014-06-15: 3 mL

## 2014-06-15 MED ORDER — ONDANSETRON HCL 4 MG/2ML IJ SOLN
INTRAMUSCULAR | Status: AC
  Filled 2014-06-15: qty 2

## 2014-06-15 MED ORDER — SODIUM BICARBONATE 4 % IV SOLN
INTRAVENOUS | Status: DC | PRN
  Administered 2014-06-15: 5 mL via INTRAVENOUS

## 2014-06-15 MED ORDER — OXYCODONE HCL 5 MG PO TABS
5.0000 mg | ORAL_TABLET | Freq: Once | ORAL | Status: AC
  Administered 2014-06-15: 5 mg via ORAL

## 2014-06-15 MED ORDER — PROPOFOL 10 MG/ML IV BOLUS
INTRAVENOUS | Status: DC | PRN
  Administered 2014-06-15: 10 mg via INTRAVENOUS
  Administered 2014-06-15: 20 mg via INTRAVENOUS
  Administered 2014-06-15: 30 mg via INTRAVENOUS

## 2014-06-15 MED ORDER — MIDAZOLAM HCL 2 MG/2ML IJ SOLN
INTRAMUSCULAR | Status: AC
  Filled 2014-06-15: qty 2

## 2014-06-15 MED ORDER — LIDOCAINE HCL (PF) 1 % IJ SOLN
INTRAMUSCULAR | Status: AC
  Filled 2014-06-15: qty 30

## 2014-06-15 MED ORDER — OXYCODONE-ACETAMINOPHEN 5-325 MG PO TABS: 1.0000 | ORAL_TABLET | ORAL | Status: AC | PRN

## 2014-06-15 MED ORDER — FENTANYL CITRATE 0.05 MG/ML IJ SOLN
INTRAMUSCULAR | Status: DC | PRN
  Administered 2014-06-15: 25 ug via INTRAVENOUS
  Administered 2014-06-15: 50 ug via INTRAVENOUS
  Administered 2014-06-15: 25 ug via INTRAVENOUS
  Administered 2014-06-15: 50 ug via INTRAVENOUS
  Administered 2014-06-15: 25 ug via INTRAVENOUS

## 2014-06-15 SURGICAL SUPPLY — 41 items
BANDAGE ELASTIC 3 VELCRO ST LF (GAUZE/BANDAGES/DRESSINGS) ×3 IMPLANT
BANDAGE ELASTIC 4 VELCRO ST LF (GAUZE/BANDAGES/DRESSINGS) ×3 IMPLANT
BNDG ESMARK 4X9 LF (GAUZE/BANDAGES/DRESSINGS) ×3 IMPLANT
BNDG GAUZE ELAST 4 BULKY (GAUZE/BANDAGES/DRESSINGS) ×3 IMPLANT
BNDG PLASTER X FAST 4X5 WHT LF (CAST SUPPLIES) ×3 IMPLANT
COVER SURGICAL LIGHT HANDLE (MISCELLANEOUS) ×3 IMPLANT
CUFF TOURNIQUET SINGLE 18IN (TOURNIQUET CUFF) ×3 IMPLANT
CUFF TOURNIQUET SINGLE 24IN (TOURNIQUET CUFF) IMPLANT
DRAPE SURG 17X23 STRL (DRAPES) ×3 IMPLANT
DURAPREP 26ML APPLICATOR (WOUND CARE) ×3 IMPLANT
ELECT NEEDLE TIP 2.8 STRL (NEEDLE) ×3 IMPLANT
GAUZE SPONGE 4X4 12PLY STRL (GAUZE/BANDAGES/DRESSINGS) ×3 IMPLANT
GAUZE XEROFORM 1X8 LF (GAUZE/BANDAGES/DRESSINGS) ×3 IMPLANT
GLOVE BIOGEL PI ORTHO PRO 7.5 (GLOVE) ×2
GLOVE BIOGEL PI ORTHO PRO SZ8 (GLOVE) ×2
GLOVE ORTHO TXT STRL SZ7.5 (GLOVE) ×3 IMPLANT
GLOVE PI ORTHO PRO STRL 7.5 (GLOVE) ×1 IMPLANT
GLOVE PI ORTHO PRO STRL SZ8 (GLOVE) ×1 IMPLANT
GLOVE SURG ORTHO 8.5 STRL (GLOVE) ×3 IMPLANT
GOWN STRL REUS W/ TWL LRG LVL3 (GOWN DISPOSABLE) ×2 IMPLANT
GOWN STRL REUS W/ TWL XL LVL3 (GOWN DISPOSABLE) ×2 IMPLANT
GOWN STRL REUS W/TWL LRG LVL3 (GOWN DISPOSABLE) ×4
GOWN STRL REUS W/TWL XL LVL3 (GOWN DISPOSABLE) ×4
KIT BASIN OR (CUSTOM PROCEDURE TRAY) ×3 IMPLANT
KIT ROOM TURNOVER OR (KITS) ×3 IMPLANT
NEEDLE HYPO 25GX1X1/2 BEV (NEEDLE) IMPLANT
NS IRRIG 1000ML POUR BTL (IV SOLUTION) ×3 IMPLANT
PACK ORTHO EXTREMITY (CUSTOM PROCEDURE TRAY) ×3 IMPLANT
PAD ARMBOARD 7.5X6 YLW CONV (MISCELLANEOUS) ×6 IMPLANT
PAD CAST 4YDX4 CTTN HI CHSV (CAST SUPPLIES) ×2 IMPLANT
PADDING CAST COTTON 4X4 STRL (CAST SUPPLIES) ×4
SPONGE SCRUB IODOPHOR (GAUZE/BANDAGES/DRESSINGS) IMPLANT
SUCTION FRAZIER TIP 10 FR DISP (SUCTIONS) IMPLANT
SUT ETHILON 3 0 PS 1 (SUTURE) ×3 IMPLANT
SYR CONTROL 10ML LL (SYRINGE) IMPLANT
TOWEL OR 17X24 6PK STRL BLUE (TOWEL DISPOSABLE) ×3 IMPLANT
TOWEL OR 17X26 10 PK STRL BLUE (TOWEL DISPOSABLE) ×3 IMPLANT
TUBE CONNECTING 12'X1/4 (SUCTIONS)
TUBE CONNECTING 12X1/4 (SUCTIONS) IMPLANT
UNDERPAD 30X30 INCONTINENT (UNDERPADS AND DIAPERS) ×3 IMPLANT
WATER STERILE IRR 1000ML POUR (IV SOLUTION) IMPLANT

## 2014-06-15 NOTE — Anesthesia Preprocedure Evaluation (Addendum)
Anesthesia Evaluation  Patient identified by MRN, date of birth, ID band Patient awake    Reviewed: Allergy & Precautions, H&P , NPO status , Patient's Chart, lab work & pertinent test results  Airway Mallampati: II   Neck ROM: full    Dental  (+) Teeth Intact, Dental Advisory Given   Pulmonary neg pulmonary ROS,          Cardiovascular hypertension,     Neuro/Psych  Headaches, Anxiety Depression  Neuromuscular disease    GI/Hepatic GERD-  ,  Endo/Other  Morbid obesity  Renal/GU      Musculoskeletal  (+) Arthritis -, Fibromyalgia -  Abdominal   Peds  Hematology   Anesthesia Other Findings   Reproductive/Obstetrics                            Anesthesia Physical Anesthesia Plan  ASA: II  Anesthesia Plan: MAC   Post-op Pain Management:    Induction: Intravenous  Airway Management Planned: Simple Face Mask  Additional Equipment:   Intra-op Plan:   Post-operative Plan:   Informed Consent: I have reviewed the patients History and Physical, chart, labs and discussed the procedure including the risks, benefits and alternatives for the proposed anesthesia with the patient or authorized representative who has indicated his/her understanding and acceptance.     Plan Discussed with: CRNA, Anesthesiologist and Surgeon  Anesthesia Plan Comments:         Anesthesia Quick Evaluation

## 2014-06-15 NOTE — Discharge Instructions (Signed)
What to eat:  For your first meals, you should eat lightly; only small meals initially.  If you do not have nausea, you may eat larger meals.  Avoid spicy, greasy and heavy food.    General Anesthesia, Adult, Care After  Refer to this sheet in the next few weeks. These instructions provide you with information on caring for yourself after your procedure. Your health care provider may also give you more specific instructions. Your treatment has been planned according to current medical practices, but problems sometimes occur. Call your health care provider if you have any problems or questions after your procedure.  WHAT TO EXPECT AFTER THE PROCEDURE  After the procedure, it is typical to experience:  Sleepiness.  Nausea and vomiting. HOME CARE INSTRUCTIONS  For the first 24 hours after general anesthesia:  Have a responsible person with you.  Do not drive a car. If you are alone, do not take public transportation.  Do not drink alcohol.  Do not take medicine that has not been prescribed by your health care provider.  Do not sign important papers or make important decisions.  You may resume a normal diet and activities as directed by your health care provider.  Change bandages (dressings) as directed.  If you have questions or problems that seem related to general anesthesia, call the hospital and ask for the anesthetist or anesthesiologist on call. SEEK MEDICAL CARE IF:  You have nausea and vomiting that continue the day after anesthesia.  You develop a rash. SEEK IMMEDIATE MEDICAL CARE IF:  You have difficulty breathing.  You have chest pain.  You have any allergic problems. Document Released: 09/21/2000 Document Revised: 02/15/2013 Document Reviewed: 12/29/2012  [facility] Patient Information 2014 [employer] [place].   This patient has been given discharge instructions by this RN.  I used a variety of media:  Verbal, Handouts and Teachback.  The patients family/friends were also included  with discharge instructions.  Belongings were returned to the patient.  ELEVATE THE HAND AND WIGGLE FINGERS,  KEEP THE SPLINT CLEAN AND DRY UNTIL FOLLOW UP IN TWO WEEKS WITH DR Ottis Vacha  CALL [phone] FOR APPOINTMENT

## 2014-06-15 NOTE — Transfer of Care (Signed)
Immediate Anesthesia Transfer of Care Note  Patient: Suzanne Bates  Procedure(s) Performed: Procedure(s): RIGHT CARPAL TUNNEL RELEASE (Right)  Patient Location: PACU  Anesthesia Type:MAC  Level of Consciousness: awake, alert  and oriented  Airway & Oxygen Therapy: Patient Spontanous Breathing  Post-op Assessment: Report given to PACU RN and Post -op Vital signs reviewed and stable  Post vital signs: Reviewed and stable  Complications: No apparent anesthesia complications

## 2014-06-15 NOTE — Brief Op Note (Signed)
06/15/2014  8:34 AM  PATIENT:  Suzanne Bates  38 y.o. female  PRE-OPERATIVE DIAGNOSIS:  RIGHT CARPAL TUNNEL SYNDROME  POST-OPERATIVE DIAGNOSIS:  RIGHT CARPAL TUNNEL SYNDROME  PROCEDURE:  Procedure(s): RIGHT CARPAL TUNNEL RELEASE (Right)  SURGEON:  Surgeon(s) and Role:    * Verlee RossettiSteven R Monserath Neff, MD - Primary  PHYSICIAN ASSISTANT:   ASSISTANTS: none   ANESTHESIA:   local and MAC  EBL:  Total I/O In: 550 [I.V.:550] Out: -   BLOOD ADMINISTERED:none  DRAINS: none   LOCAL MEDICATIONS USED:  MARCAINE     SPECIMEN:  No Specimen  DISPOSITION OF SPECIMEN:  N/A  COUNTS:  YES  TOURNIQUET:   Total Tourniquet Time Documented: Upper Arm (Right) - 20 minutes Total: Upper Arm (Right) - 20 minutes   DICTATION: .Other Dictation: Dictation Number [id number]  PLAN OF CARE: Discharge to home after PACU  PATIENT DISPOSITION:  PACU - hemodynamically stable.   Delay start of Pharmacological VTE agent (>24hrs) due to surgical blood loss or risk of bleeding: not applicable

## 2014-06-15 NOTE — Interval H&P Note (Signed)
History and Physical Interval Note:  06/15/2014 7:27 AM  Suzanne Bates  has presented today for surgery, with the diagnosis of RIGHT CARPAL TUNNEL SYNDROME  The various methods of treatment have been discussed with the patient and family. After consideration of risks, benefits and other options for treatment, the patient has consented to  Procedure(s): RIGHT CARPAL TUNNEL RELEASE (Right) as a surgical intervention .  The patient's history has been reviewed, patient examined, no change in status, stable for surgery.  I have reviewed the patient's chart and labs.  Questions were answered to the patient's satisfaction.     Corrinne Benegas,STEVEN R

## 2014-06-16 NOTE — Op Note (Signed)
[facility] [place]:  Lortz, Stpehanie              ACCOUNT NO.:  [id number]  MEDICAL RECORD NO.:  [id number]  LOCATION:  [facility]                         FACILITY:  [facility]  PHYSICIAN:  Almedia BallsSteven R. Ranell PatrickNorris, M.D. DATE OF BIRTH:  July 21, 1975  DATE OF PROCEDURE:  06/15/2014 DATE OF DISCHARGE:  06/15/2014                              OPERATIVE REPORT   PREOPERATIVE DIAGNOSIS:  Right hand carpal tunnel syndrome.  POSTOPERATIVE DIAGNOSIS:  Right hand carpal tunnel syndrome.  PROCEDURE PERFORMED:  Right hand carpal tunnel release.  ATTENDING SURGEON:  Almedia BallsSteven R. Ranell PatrickNorris, M.D.  ASSISTANT:  None.  ANESTHESIA:  Local anesthesia/carpal block plus MAC was used.  ESTIMATED BLOOD LOSS:  Minimal.  FLUID REPLACEMENT:  500 mL crystalloid.  INSTRUMENT COUNTS:  Correct.  COMPLICATIONS:  There were no complications.  ANTIBIOTICS:  Perioperative antibiotics were given.  TOURNIQUET TIME:  20 minutes at 250 mmHg.  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  She has had a prior left carpal tunnel.  She has done great with that and has progressive carpal tunnel symptoms and EMG nerve conduction study positive for same.  The patient desires operative treatment to restore function, eliminate pain to her hand.  Informed consent obtained.  DESCRIPTION OF PROCEDURE:  After an adequate level of anesthesia achieved, the patient was positioned supine on the operating room table. Nonsterile tourniquet was placed on right proximal forearm.  The forearm was then sterilely prepped and draped in usual manner.  Time-out was called.  We elevated and exsanguinated the limb using an Esmarch bandage.  Tourniquet was elevated to 250 mmHg.  Longitudinal palmar incision was created after a time-out had been called in line with the third and fourth ray under loupe magnification.  We dissected down through the subcutaneous tissues, identified the  superficial palmar fascia, divided in line with skin incision, then released the transverse carpal ligament under direct vision protecting the median nerve.  We had a complete release of the nerve from the superficial forearm compartment into the midpalmar space.  We irrigated and inspected the nerve and make sure it was in good shape and then we irrigated and then closed the wound with interrupted nylon suture.  Sterile compressive bandage and short arm splint were applied.  The patient tolerated the surgery well.     Almedia BallsSteven R. Ranell PatrickNorris, M.D.     SRN/MEDQ  D:  06/15/2014  T:  06/16/2014  Job:  [id number]

## 2014-06-19 ENCOUNTER — Encounter (HOSPITAL_COMMUNITY): Payer: Self-pay | Admitting: Orthopedic Surgery

## 2014-06-25 NOTE — Anesthesia Postprocedure Evaluation (Signed)
Anesthesia Post Note  Patient: Suzanne Bates  Procedure(s) Performed: Procedure(s) (LRB): RIGHT CARPAL TUNNEL RELEASE (Right)  Anesthesia type: MAC  Patient location: PACU  Post pain: Pain level controlled and Adequate analgesia  Post assessment: Post-op Vital signs reviewed, Patient's Cardiovascular Status Stable and Respiratory Function Stable  Last Vitals:  Filed Vitals:   06/15/14 0915  BP: 103/65  Pulse: 84  Temp:   Resp:     Post vital signs: Reviewed and stable  Level of consciousness: awake, alert  and oriented  Complications: No apparent anesthesia complications

## 2015-01-15 IMAGING — CR DG CHEST 2V
2 series · 2 of 2 positions shown · non-contrast
Comparison: None.

CLINICAL DATA: Preoperative evaluation; hypertension

EXAM:
CHEST  2 VIEW

[w chest pa]
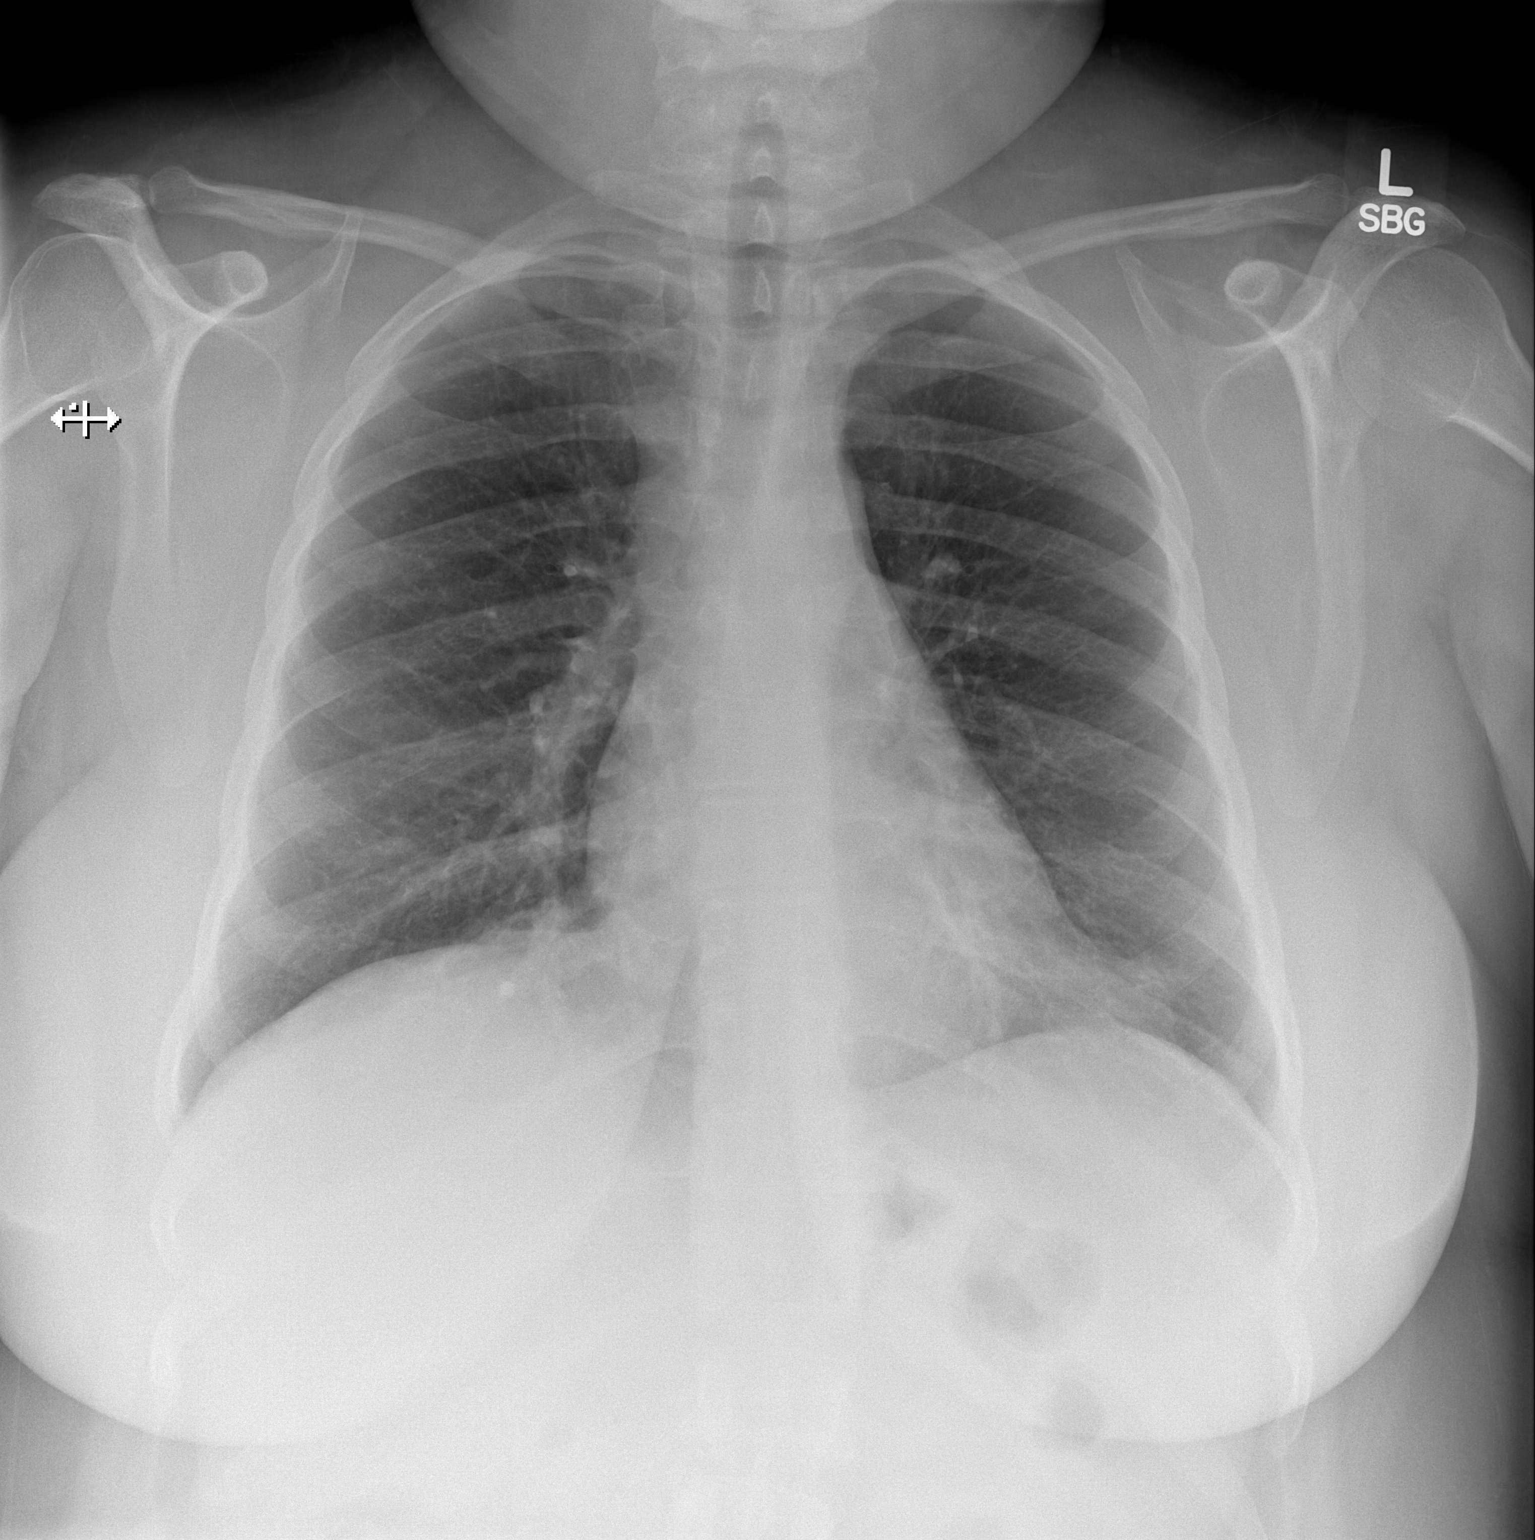

[w chest lat]
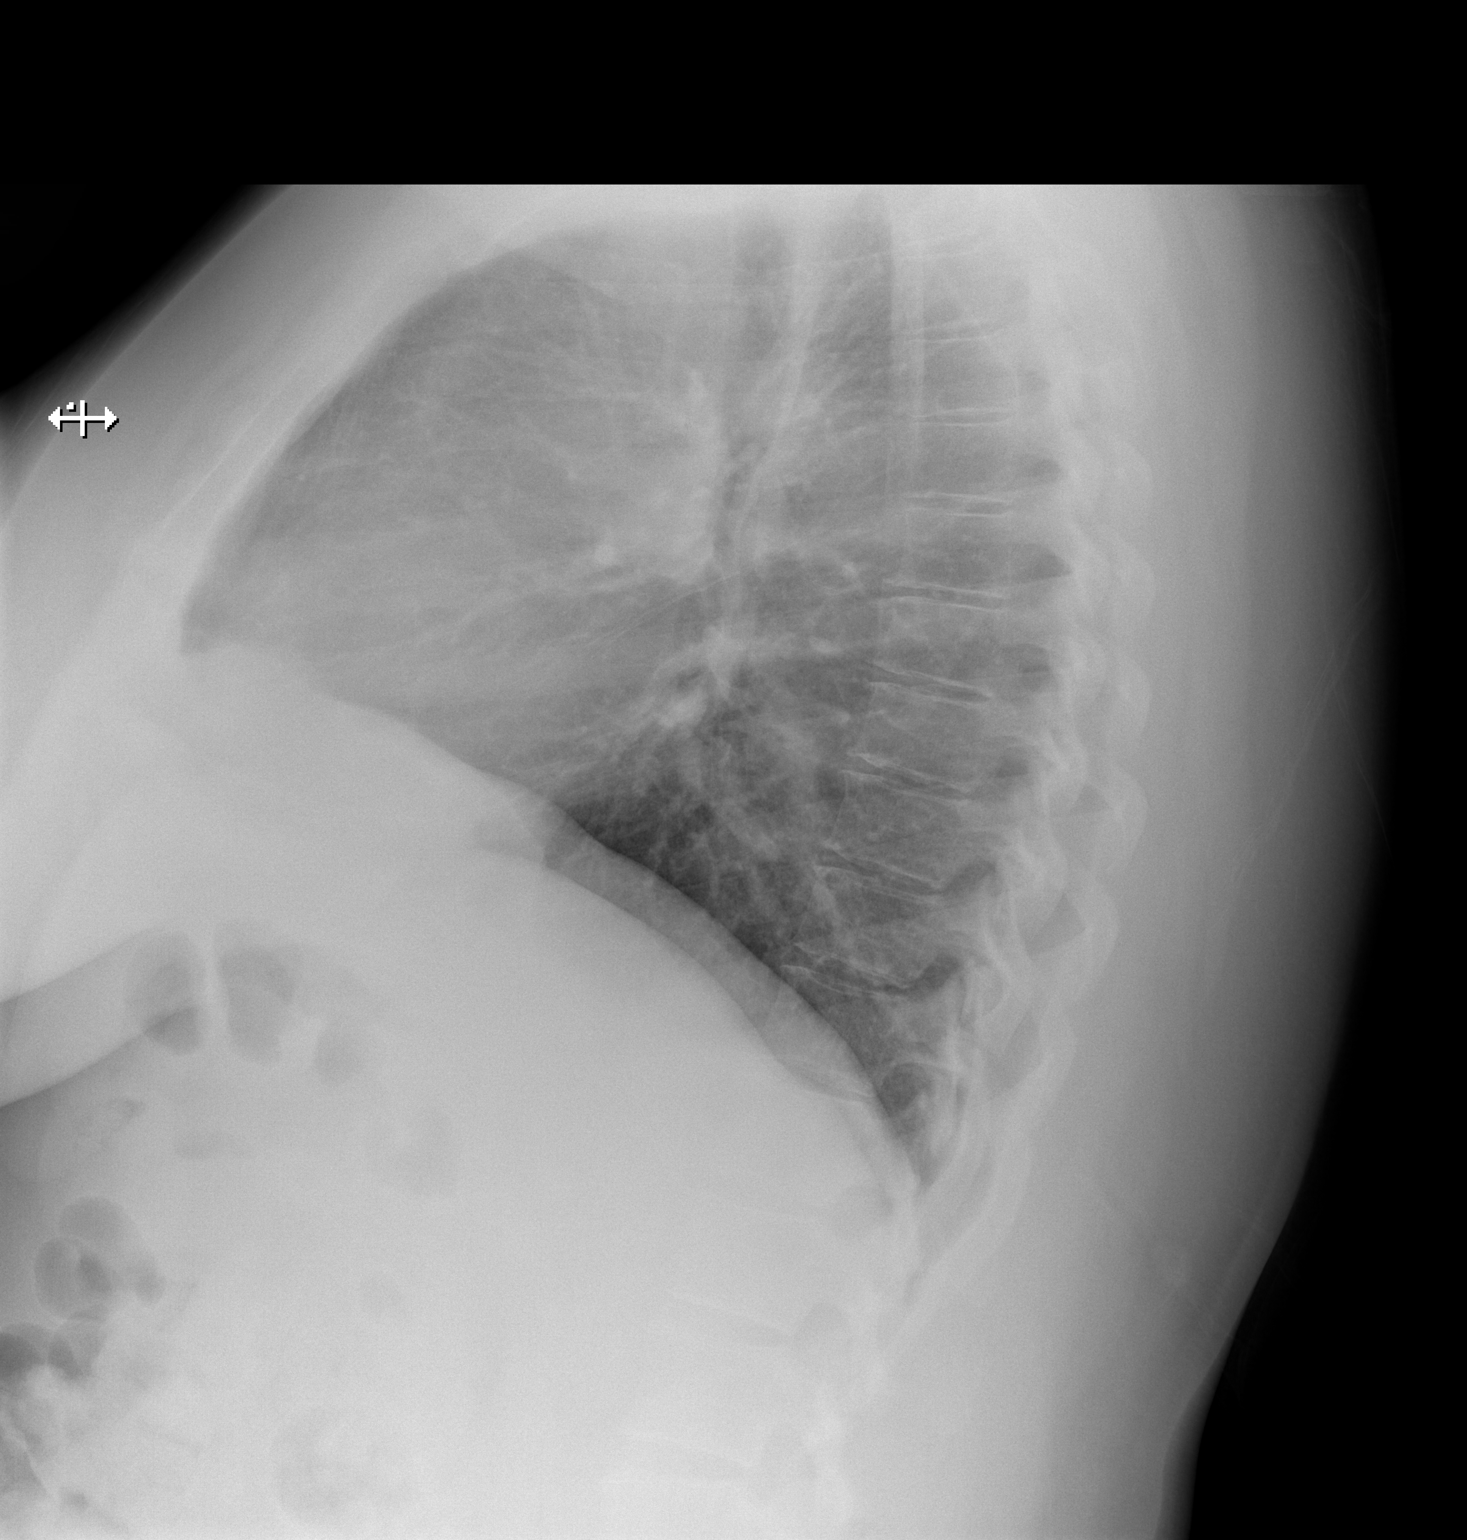

[2 of 2 positions shown; findings below may reference images not displayed]

FINDINGS: Lungs are clear. Heart size and pulmonary vascularity are normal. No
adenopathy. No bone lesions.
IMPRESSION: No edema or consolidation.
